# Patient Record
Sex: Male | Born: 1940 | Race: White | Hispanic: No | Marital: Married | Smoking: Never smoker
Health system: Southern US, Community
[De-identification: ages and names within clinical notes are randomized; demographics above are authoritative.]

## PROBLEM LIST (undated history)

## (undated) DIAGNOSIS — E079 Disorder of thyroid, unspecified: Secondary | ICD-10-CM

## (undated) DIAGNOSIS — I10 Essential (primary) hypertension: Secondary | ICD-10-CM

## (undated) DIAGNOSIS — E119 Type 2 diabetes mellitus without complications: Secondary | ICD-10-CM

## (undated) DIAGNOSIS — I639 Cerebral infarction, unspecified: Secondary | ICD-10-CM

## (undated) HISTORY — PX: COLONOSCOPY: SHX174

---

## 2015-09-21 ENCOUNTER — Emergency Department (HOSPITAL_COMMUNITY): Payer: Medicare Other

## 2015-09-21 ENCOUNTER — Encounter (HOSPITAL_COMMUNITY): Payer: Self-pay | Admitting: Emergency Medicine

## 2015-09-21 ENCOUNTER — Emergency Department (HOSPITAL_COMMUNITY)
Admission: EM | Admit: 2015-09-21 | Discharge: 2015-09-21 | Disposition: A | Discharge status: Home / Self Care | Payer: Medicare Other | Attending: Emergency Medicine | Admitting: Emergency Medicine

## 2015-09-21 DIAGNOSIS — Z8673 Personal history of transient ischemic attack (TIA), and cerebral infarction without residual deficits: Secondary | ICD-10-CM | POA: Diagnosis not present

## 2015-09-21 DIAGNOSIS — Y999 Unspecified external cause status: Secondary | ICD-10-CM | POA: Diagnosis not present

## 2015-09-21 DIAGNOSIS — R0789 Other chest pain: Secondary | ICD-10-CM

## 2015-09-21 DIAGNOSIS — S299XXA Unspecified injury of thorax, initial encounter: Secondary | ICD-10-CM | POA: Insufficient documentation

## 2015-09-21 DIAGNOSIS — I10 Essential (primary) hypertension: Secondary | ICD-10-CM | POA: Diagnosis not present

## 2015-09-21 DIAGNOSIS — Y929 Unspecified place or not applicable: Secondary | ICD-10-CM | POA: Diagnosis not present

## 2015-09-21 DIAGNOSIS — E119 Type 2 diabetes mellitus without complications: Secondary | ICD-10-CM | POA: Diagnosis not present

## 2015-09-21 DIAGNOSIS — Y939 Activity, unspecified: Secondary | ICD-10-CM | POA: Diagnosis not present

## 2015-09-21 DIAGNOSIS — R52 Pain, unspecified: Secondary | ICD-10-CM

## 2015-09-21 HISTORY — DX: Type 2 diabetes mellitus without complications: E11.9

## 2015-09-21 HISTORY — DX: Essential (primary) hypertension: I10

## 2015-09-21 HISTORY — DX: Disorder of thyroid, unspecified: E07.9

## 2015-09-21 HISTORY — DX: Cerebral infarction, unspecified: I63.9

## 2015-09-21 MED ORDER — TRAMADOL HCL 50 MG PO TABS: 50.0000 mg | ORAL_TABLET | Freq: Four times a day (QID) | ORAL | Status: AC | PRN

## 2015-09-21 MED ORDER — METHOCARBAMOL 500 MG PO TABS: 500.0000 mg | ORAL_TABLET | Freq: Four times a day (QID) | ORAL | Status: AC

## 2015-09-21 MED ORDER — ACETAMINOPHEN 325 MG PO TABS
650.0000 mg | ORAL_TABLET | Freq: Once | ORAL | Status: AC
  Administered 2015-09-21: 650 mg via ORAL
  Filled 2015-09-21: qty 2
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
## 2015-09-21 NOTE — ED Notes (Signed)
Pt educated on discharge instructions, prescriptions, and follow up care. Pt and family verbalize understanding.
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
